# Patient Record
Sex: Male | Born: 1980 | Race: White | Hispanic: No | Marital: Single | Smoking: Current some day smoker
Health system: Southern US, Community
[De-identification: ages and names within clinical notes are randomized; demographics above are authoritative.]

## PROBLEM LIST (undated history)

## (undated) DIAGNOSIS — R361 Hematospermia: Secondary | ICD-10-CM

## (undated) DIAGNOSIS — R310 Gross hematuria: Secondary | ICD-10-CM

---

## 2009-02-04 ENCOUNTER — Encounter: Admission: RE | Admit: 2009-02-04 | Discharge: 2009-02-04 | Payer: Self-pay | Admitting: Family Medicine

## 2014-05-26 ENCOUNTER — Other Ambulatory Visit: Payer: Self-pay | Admitting: Family Medicine

## 2014-05-26 DIAGNOSIS — R319 Hematuria, unspecified: Secondary | ICD-10-CM

## 2014-05-27 ENCOUNTER — Ambulatory Visit
Admission: RE | Admit: 2014-05-27 | Discharge: 2014-05-27 | Disposition: A | Discharge status: Home / Self Care | Payer: 59 | Source: Ambulatory Visit | Attending: Family Medicine | Admitting: Family Medicine

## 2014-05-27 DIAGNOSIS — R319 Hematuria, unspecified: Secondary | ICD-10-CM

## 2014-05-27 MED ORDER — IOHEXOL 300 MG/ML  SOLN
150.0000 mL | Freq: Once | INTRAMUSCULAR | Status: AC | PRN
  Administered 2014-05-27: 150 mL via INTRAVENOUS

## 2014-11-07 ENCOUNTER — Other Ambulatory Visit: Payer: Self-pay | Admitting: Urology

## 2014-11-07 ENCOUNTER — Encounter: Payer: Self-pay | Admitting: Registered Nurse

## 2014-11-16 ENCOUNTER — Encounter (HOSPITAL_BASED_OUTPATIENT_CLINIC_OR_DEPARTMENT_OTHER): Payer: Self-pay | Admitting: *Deleted

## 2014-11-23 ENCOUNTER — Ambulatory Visit (HOSPITAL_BASED_OUTPATIENT_CLINIC_OR_DEPARTMENT_OTHER): Admission: RE | Admit: 2014-11-23 | Payer: 59 | Source: Ambulatory Visit | Admitting: Urology

## 2014-11-23 ENCOUNTER — Encounter (HOSPITAL_BASED_OUTPATIENT_CLINIC_OR_DEPARTMENT_OTHER): Admission: RE | Payer: Self-pay | Source: Ambulatory Visit

## 2014-11-23 HISTORY — DX: Gross hematuria: R31.0

## 2014-11-23 HISTORY — DX: Hematospermia: R36.1

## 2014-11-23 SURGERY — CYSTOSCOPY WITH RETROGRADE URETHROGRAM
Anesthesia: General

## 2015-05-17 IMAGING — CT CT ABD-PEL WO/W CM
2 of 8 series · 15 of 46 positions shown, 19 images · IV contrast (WATER & [ID] OMNI 300)
Comparison: None.

CLINICAL DATA: Gross hematuria.

EXAM:
CT ABDOMEN AND PELVIS WITHOUT AND WITH CONTRAST
TECHNIQUE: Multidetector CT imaging of the abdomen and pelvis was performed
following the standard protocol before and following the bolus
administration of intravenous contrast.
CONTRAST:  150mL OMNIPAQUE IOHEXOL 300 MG/ML  SOLN

[Series 7: prone thin · axial · 0.72mm/px · z∈[-437,+29]mm · 12 of 541 slices shown, 16 images]
[im 50/541  soft-tissue]
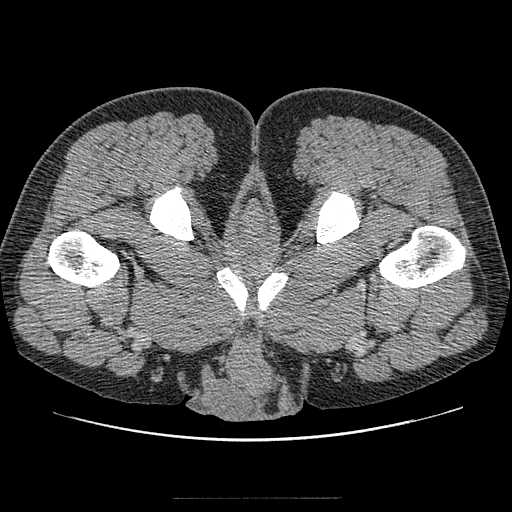
[im 50/541  bone]
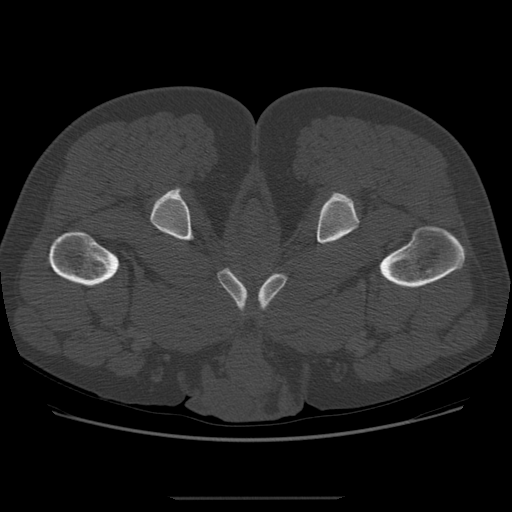
[im 99/541  soft-tissue]
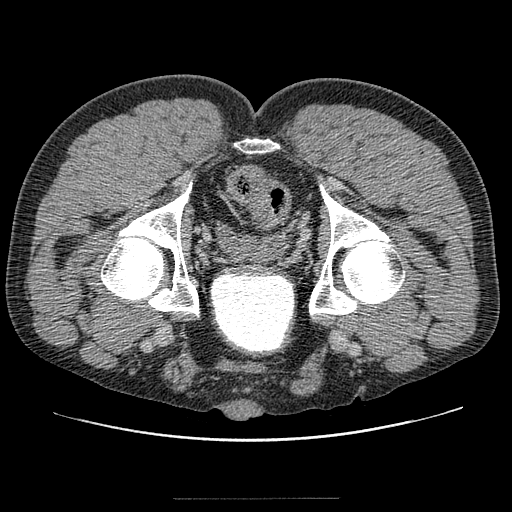
[im 148/541  soft-tissue]
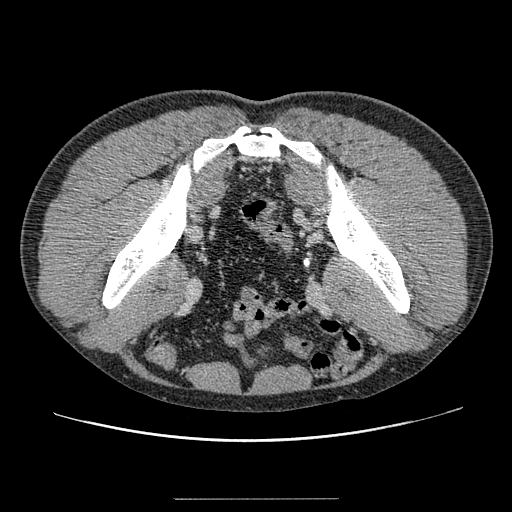
[im 197/541  soft-tissue]
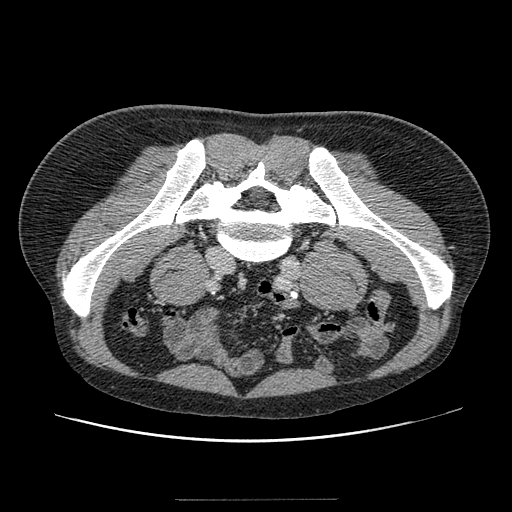
[im 246/541  soft-tissue]
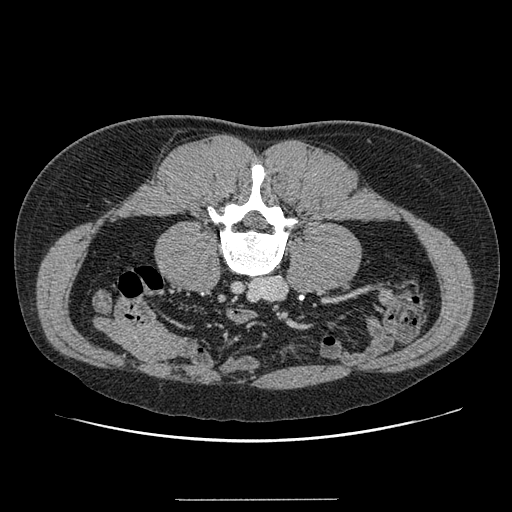
[im 295/541  soft-tissue]
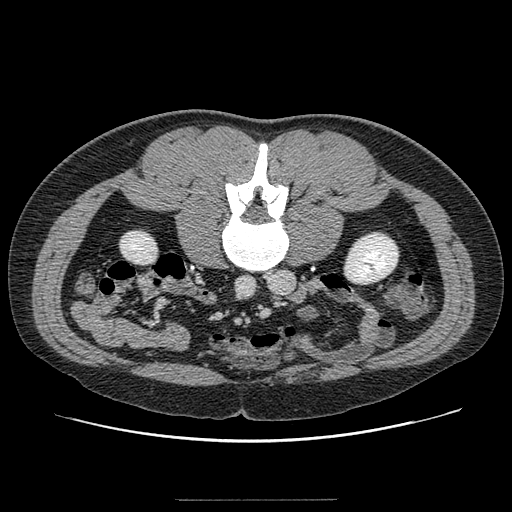
[im 344/541  soft-tissue]
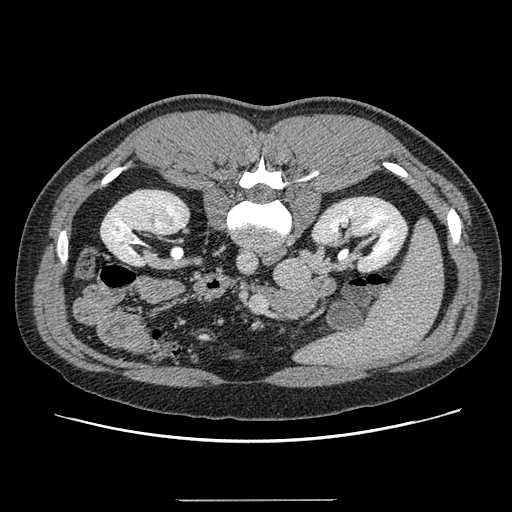
[im 393/541  soft-tissue]
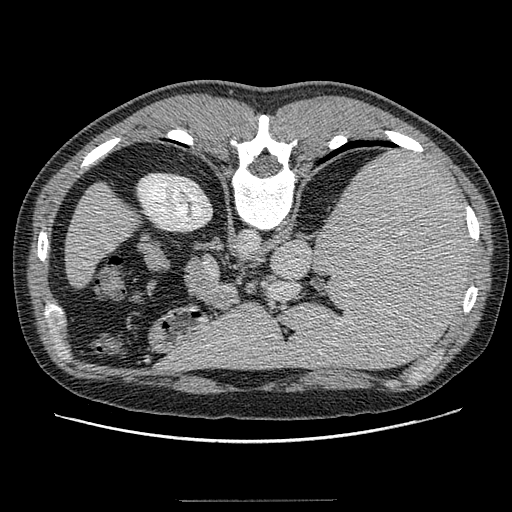
[im 442/541  soft-tissue]
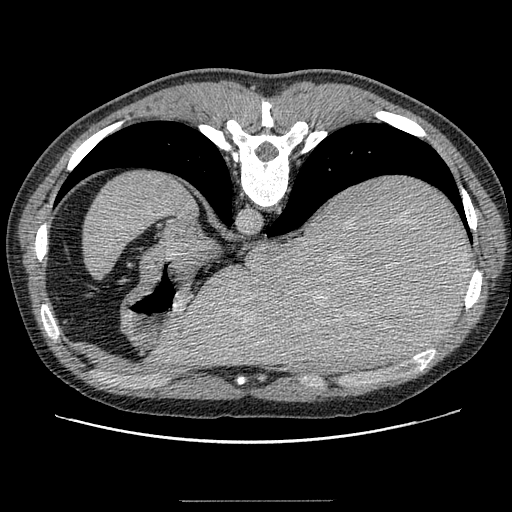
[im 442/541  lung]
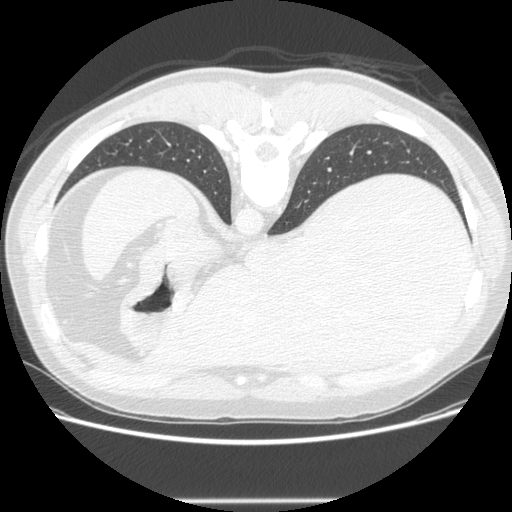
[im 442/541  bone]
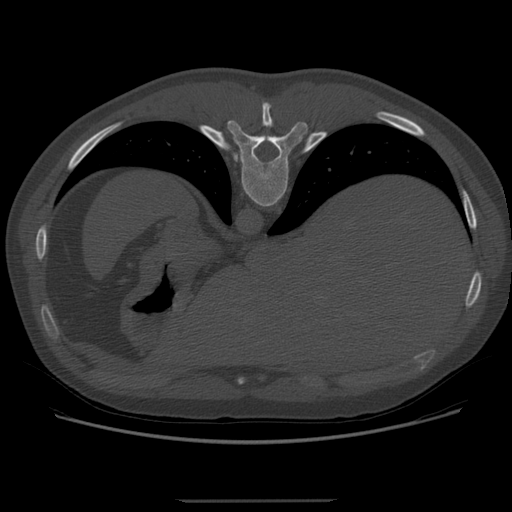
[im 467/541  lung]
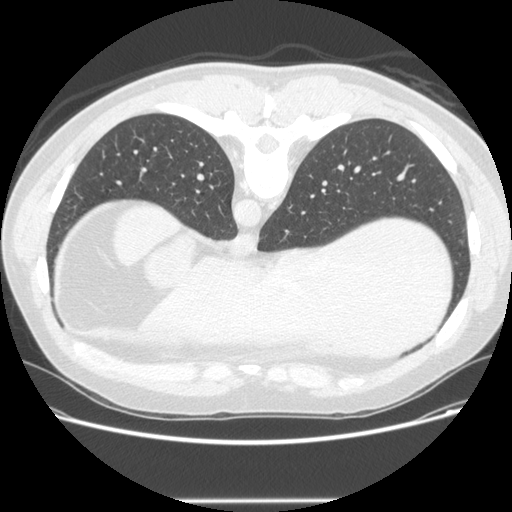
[im 491/541  soft-tissue]
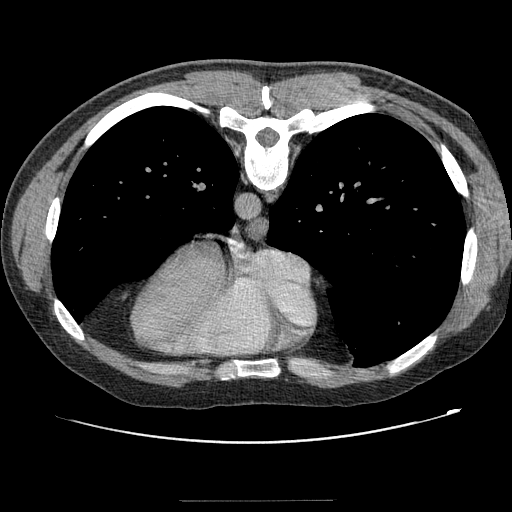
[im 491/541  lung]
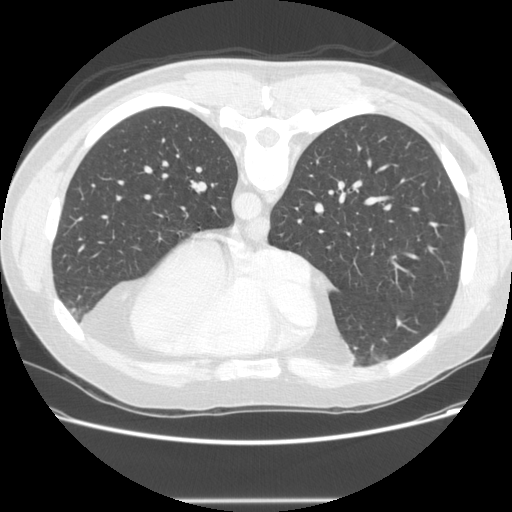
[im 516/541  lung]
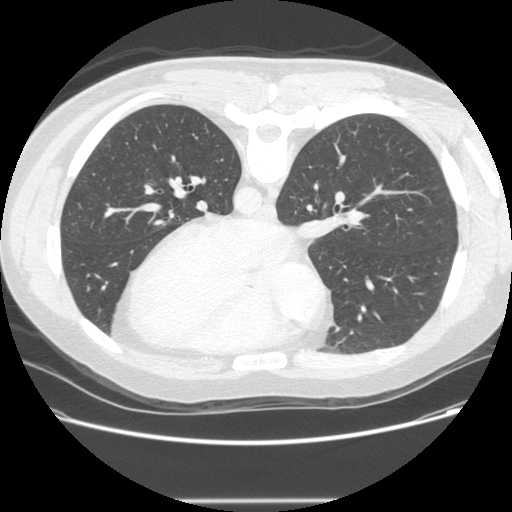

[Series 301: cor w/o · coronal · non-contrast · 1.00mm/px · 3 of 139 slices shown]
[im 35/139  soft-tissue]
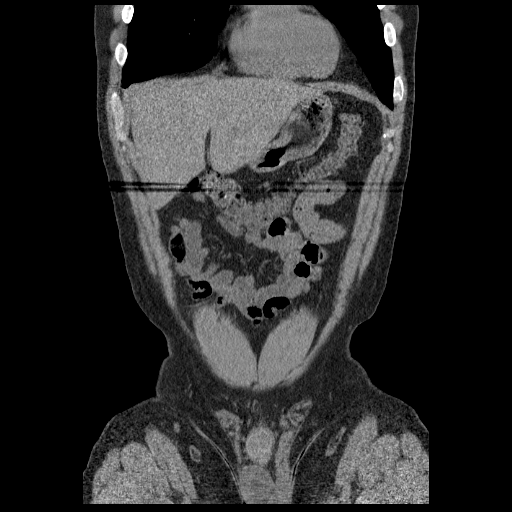
[im 70/139  soft-tissue]
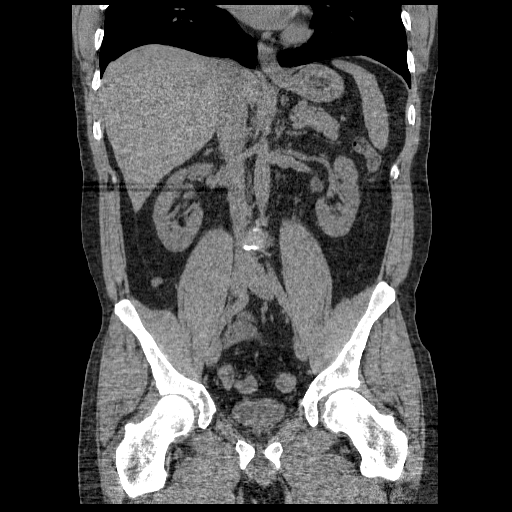
[im 104/139  soft-tissue]
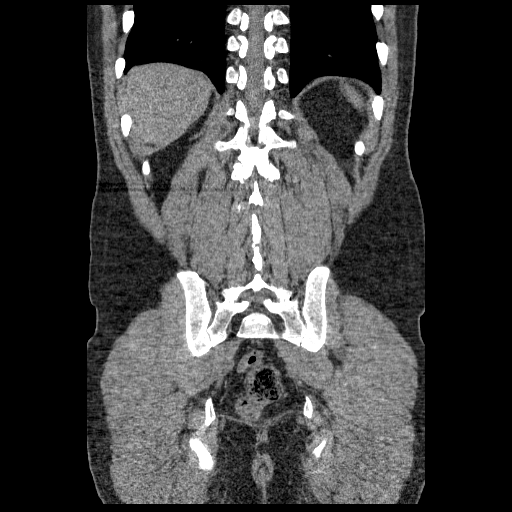

[15 of 46 positions shown; findings below may reference images not displayed]

FINDINGS: Lower chest:  Unremarkable.

Hepatobiliary: No masses or other significant abnormality
identified.

Pancreas: No cystic or solid masses identified. No peripancreatic
inflammatory changes or fluid collections.

Spleen:  Within normal limits in size and appearance.

Adrenal Glands:  No masses identified.

Kidneys/Urinary Tract: No evidence of urolithiasis or
hydronephrosis. No complex cystic or solid renal masses identified.
No masses seen involving lower urinary tract. Mild diffuse bladder
wall thickening is noted although the urinary bladder is
incompletely distended on this exam.

Stomach/Bowel/Peritoneum: No evidence of wall thickening, mass, or
obstruction.

Vascular/Lymphatic: No pathologically enlarged lymph nodes
identified. No other significant abnormality identified.

Reproductive:  No mass or other significant abnormality identified.

Other:  None.

Musculoskeletal:  No suspicious bone lesions identified.
IMPRESSION: No radiographic evidence of urinary tract neoplasm, urolithiasis, or
hydronephrosis.

Mild diffuse bladder wall thickening although bladder is
incompletely distended. Cystitis cannot be excluded. Suggest
correlation with urinalysis, and consider cystoscopy for further
evaluation.

## 2015-07-07 ENCOUNTER — Emergency Department (HOSPITAL_COMMUNITY)
Admission: EM | Admit: 2015-07-07 | Discharge: 2015-07-08 | Disposition: A | Discharge status: Home / Self Care | Payer: Commercial Managed Care - HMO | Attending: Emergency Medicine | Admitting: Emergency Medicine

## 2015-07-07 ENCOUNTER — Emergency Department (HOSPITAL_COMMUNITY): Payer: Commercial Managed Care - HMO

## 2015-07-07 ENCOUNTER — Encounter (HOSPITAL_COMMUNITY): Payer: Self-pay | Admitting: Emergency Medicine

## 2015-07-07 DIAGNOSIS — Y9389 Activity, other specified: Secondary | ICD-10-CM | POA: Insufficient documentation

## 2015-07-07 DIAGNOSIS — Y998 Other external cause status: Secondary | ICD-10-CM | POA: Diagnosis not present

## 2015-07-07 DIAGNOSIS — S6992XA Unspecified injury of left wrist, hand and finger(s), initial encounter: Secondary | ICD-10-CM | POA: Diagnosis present

## 2015-07-07 DIAGNOSIS — W270XXA Contact with workbench tool, initial encounter: Secondary | ICD-10-CM | POA: Diagnosis not present

## 2015-07-07 DIAGNOSIS — Y9289 Other specified places as the place of occurrence of the external cause: Secondary | ICD-10-CM | POA: Diagnosis not present

## 2015-07-07 DIAGNOSIS — S61012A Laceration without foreign body of left thumb without damage to nail, initial encounter: Secondary | ICD-10-CM | POA: Insufficient documentation

## 2015-07-07 DIAGNOSIS — Z79899 Other long term (current) drug therapy: Secondary | ICD-10-CM | POA: Insufficient documentation

## 2015-07-07 NOTE — ED Notes (Signed)
Patient was cutting wood with an axe. While holding the wood patient cut his thumb with the axe. Patient is bleeding. Gauze applied to laceration

## 2015-07-08 NOTE — Discharge Instructions (Signed)

## 2015-07-08 NOTE — ED Provider Notes (Signed)
CSN: [id number]     Arrival date & time 07/07/15  2329 History   First MD Initiated Contact with Patient 07/08/15 0011     Chief Complaint  Patient presents with  . Laceration    left thumb     (Consider location/radiation/quality/duration/timing/severity/associated sxs/prior Treatment) Patient is a 35 y.o. male presenting with skin laceration. The history is provided by the patient. No language interpreter was used.  Laceration Location:  Hand Hand laceration location:  L finger Depth:  Through dermis Injury mechanism: Patient was chopping wood with an ax and his his left thumb causing laceration. Foreign body present:  No foreign bodies Relieved by:  Nothing Tetanus status:  Up to date   Past Medical History  Diagnosis Date  . Gross hematuria   . Hematospermia    History reviewed. No pertinent past surgical history. History reviewed. No pertinent family history. Social History  Substance Use Topics  . Smoking status: None  . Smokeless tobacco: None  . Alcohol Use: None    Review of Systems  Constitutional: Negative for fever.  Musculoskeletal:       See HPI.  Skin: Positive for wound.  Neurological: Negative for numbness.      Allergies  Review of patient's allergies indicates no known allergies.  Home Medications   Prior to Admission medications   Medication Sig Start Date End Date Taking? Authorizing Provider  Multiple Vitamin (MULTIVITAMIN WITH MINERALS) TABS tablet Take 1 tablet by mouth daily.   Yes Historical Provider, MD  omega-3 acid ethyl esters (LOVAZA) 1 g capsule Take 1 g by mouth 2 (two) times daily.  05/02/15  Yes Historical Provider, MD   BP 145/95 mmHg  Pulse 106  Temp(Src) 97.9 F (36.6 C) (Oral)  Resp 14  Ht  (1.778 m)  Wt 79.379 kg  BMI 25.11 kg/m2  SpO2 99% Physical Exam  Constitutional: He is oriented to person, place, and time. He appears well-developed and well-nourished. No distress.  Neck: Normal range of motion.   Pulmonary/Chest: Effort normal.  Musculoskeletal:  FROM PIP joint of left thumb.  Neurological: He is alert and oriented to person, place, and time.  Left thumb NVI.  Skin:  4 cm flap laceration left thumb from medial nail cuticle to central pad of thumb. Nail intact. No subungual hematoma.     ED Course  Procedures (including critical care time) Labs Review Labs Reviewed - No data to display  Imaging Review Dg Finger Thumb Left  07/08/2015  CLINICAL DATA:  35 year old male with trauma to the left palm. EXAM: LEFT THUMB 2+V COMPARISON:  None. FINDINGS: No acute fracture or dislocation. The soft tissue swelling. No radiopaque foreign object. IMPRESSION: No acute/ traumatic osseous pathology. Electronically Signed   By: Elgie Collard M.D.   On: 07/08/2015 00:35   I have personally reviewed and evaluated these images and lab results as part of my medical decision-making.   EKG Interpretation None     LACERATION REPAIR Performed by: Elpidio Anis A Authorized by: Elpidio Anis A Consent: Verbal consent obtained. Risks and benefits: risks, benefits and alternatives were discussed Consent given by: patient Patient identity confirmed: provided demographic data Prepped and Draped in normal sterile fashion Wound explored  Laceration Location: left thumb  Laceration Length: 4 cm  No Foreign Bodies seen or palpated  Anesthesia: local infiltration  Local anesthetic: lidocaine 1% w/o epinephrine  Anesthetic total: 2 ml  Irrigation method: syringe Amount of cleaning: standard  Skin closure: 4-0 prolene  Number  of sutures: 7  Technique: simple interrutped  Patient tolerance: Patient tolerated the procedure well with no immediate complications.  MDM   Final diagnoses:  Laceration of thumb, left, initial encounter    Uncomplicated flap laceration left thumb without tendon or nail injury.    Elpidio Anis, PA-C 07/08/15 1610  Dione Booze, MD 07/08/15 [phone]

## 2016-06-26 IMAGING — CR DG FINGER THUMB 2+V*L*
3 series · 3 of 3 positions shown · non-contrast
Comparison: None.

CLINICAL DATA: 34-year-old male with trauma to the left palm.

EXAM:
LEFT THUMB 2+V

[x finger pa left]
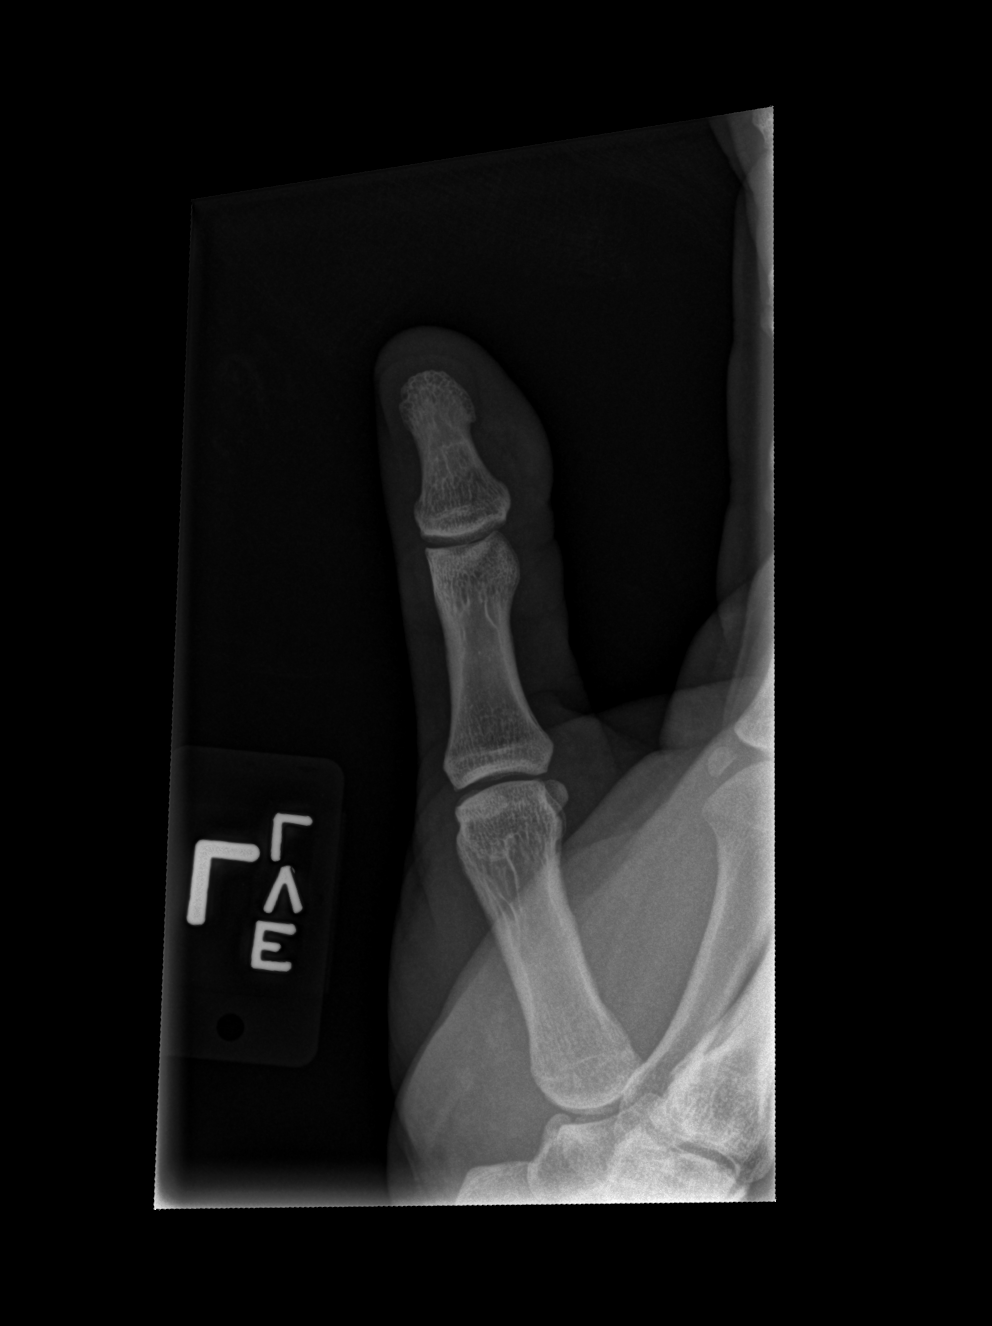

[x finger obl left]
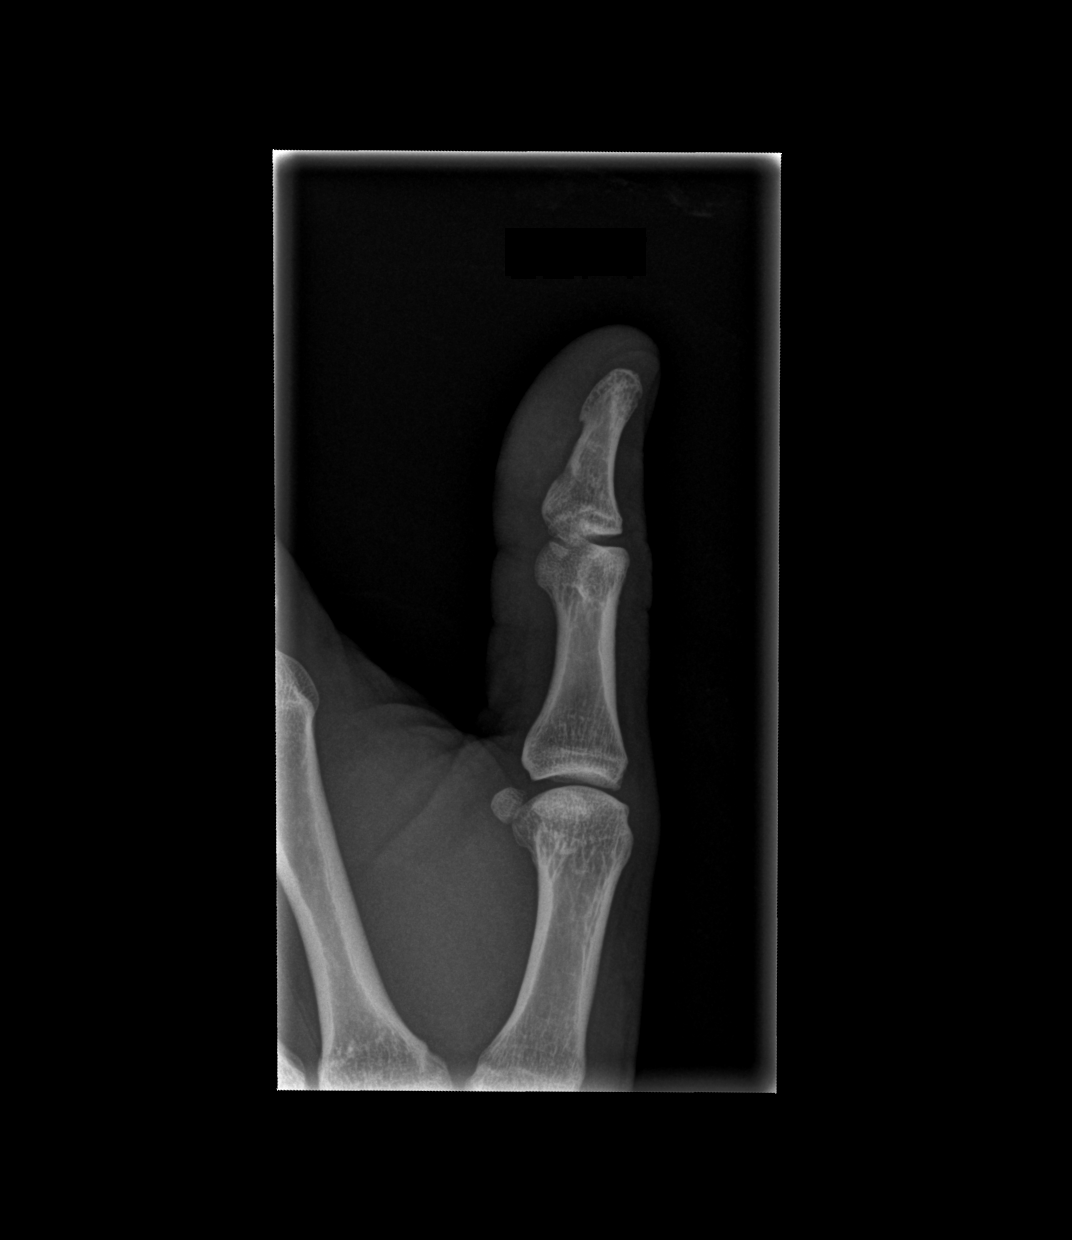

[x finger lat left]
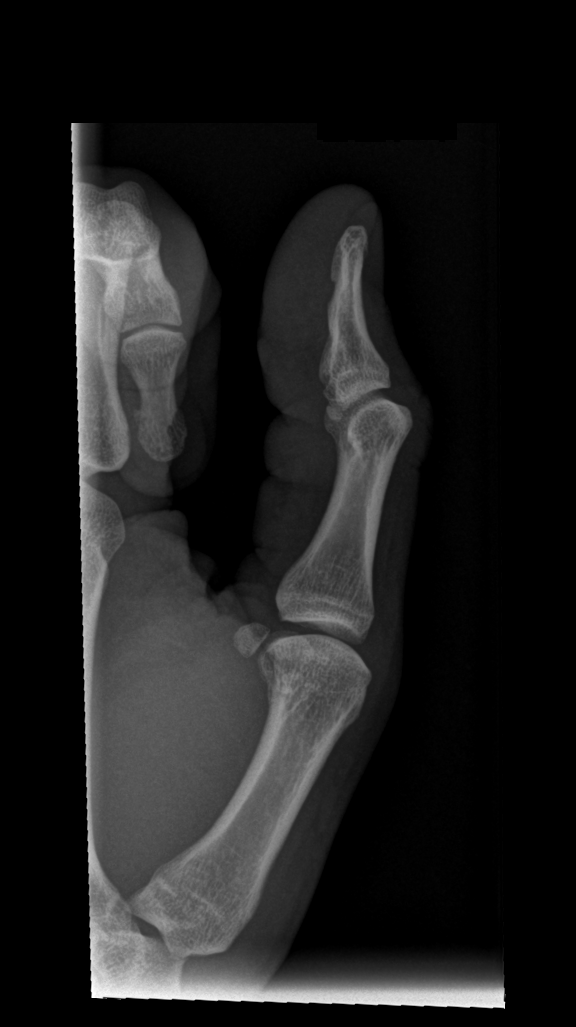

[3 of 3 positions shown; findings below may reference images not displayed]

FINDINGS: No acute fracture or dislocation. The soft tissue swelling. No
radiopaque foreign object.
IMPRESSION: No acute/ traumatic osseous pathology.

## 2017-01-21 DIAGNOSIS — E785 Hyperlipidemia, unspecified: Secondary | ICD-10-CM | POA: Diagnosis not present

## 2017-01-21 DIAGNOSIS — Z0000 Encounter for general adult medical examination without abnormal findings: Secondary | ICD-10-CM | POA: Diagnosis not present

## 2017-01-21 DIAGNOSIS — Z79899 Other long term (current) drug therapy: Secondary | ICD-10-CM | POA: Diagnosis not present

## 2017-12-02 DIAGNOSIS — M549 Dorsalgia, unspecified: Secondary | ICD-10-CM | POA: Diagnosis not present

## 2017-12-03 ENCOUNTER — Other Ambulatory Visit: Payer: Self-pay | Admitting: Family Medicine

## 2017-12-03 DIAGNOSIS — M5126 Other intervertebral disc displacement, lumbar region: Secondary | ICD-10-CM

## 2017-12-09 DIAGNOSIS — M4726 Other spondylosis with radiculopathy, lumbar region: Secondary | ICD-10-CM | POA: Diagnosis not present

## 2017-12-09 DIAGNOSIS — M5116 Intervertebral disc disorders with radiculopathy, lumbar region: Secondary | ICD-10-CM | POA: Diagnosis not present

## 2017-12-09 DIAGNOSIS — M545 Low back pain: Secondary | ICD-10-CM | POA: Diagnosis not present

## 2017-12-12 ENCOUNTER — Other Ambulatory Visit: Payer: Self-pay | Admitting: Orthopedic Surgery

## 2017-12-12 DIAGNOSIS — M5116 Intervertebral disc disorders with radiculopathy, lumbar region: Secondary | ICD-10-CM

## 2017-12-23 ENCOUNTER — Ambulatory Visit (INDEPENDENT_AMBULATORY_CARE_PROVIDER_SITE_OTHER): Payer: Self-pay | Admitting: Orthopaedic Surgery

## 2018-03-06 DIAGNOSIS — E785 Hyperlipidemia, unspecified: Secondary | ICD-10-CM | POA: Diagnosis not present

## 2018-03-06 DIAGNOSIS — Z0000 Encounter for general adult medical examination without abnormal findings: Secondary | ICD-10-CM | POA: Diagnosis not present

## 2018-03-25 DIAGNOSIS — D696 Thrombocytopenia, unspecified: Secondary | ICD-10-CM | POA: Diagnosis not present

## 2018-07-13 DIAGNOSIS — R310 Gross hematuria: Secondary | ICD-10-CM | POA: Diagnosis not present

## 2018-07-16 DIAGNOSIS — R310 Gross hematuria: Secondary | ICD-10-CM | POA: Diagnosis not present

## 2018-07-16 DIAGNOSIS — R361 Hematospermia: Secondary | ICD-10-CM | POA: Diagnosis not present

## 2018-07-30 DIAGNOSIS — R310 Gross hematuria: Secondary | ICD-10-CM | POA: Diagnosis not present

## 2018-08-09 ENCOUNTER — Other Ambulatory Visit: Payer: Self-pay

## 2018-08-09 ENCOUNTER — Emergency Department (HOSPITAL_COMMUNITY)
Admission: EM | Admit: 2018-08-09 | Discharge: 2018-08-09 | Disposition: A | Discharge status: Home / Self Care | Payer: 59 | Attending: Emergency Medicine | Admitting: Emergency Medicine

## 2018-08-09 ENCOUNTER — Emergency Department (HOSPITAL_COMMUNITY): Payer: 59

## 2018-08-09 ENCOUNTER — Encounter (HOSPITAL_COMMUNITY): Payer: Self-pay | Admitting: Emergency Medicine

## 2018-08-09 DIAGNOSIS — Y998 Other external cause status: Secondary | ICD-10-CM | POA: Diagnosis not present

## 2018-08-09 DIAGNOSIS — Y92838 Other recreation area as the place of occurrence of the external cause: Secondary | ICD-10-CM | POA: Insufficient documentation

## 2018-08-09 DIAGNOSIS — M25512 Pain in left shoulder: Secondary | ICD-10-CM | POA: Diagnosis not present

## 2018-08-09 DIAGNOSIS — S43102A Unspecified dislocation of left acromioclavicular joint, initial encounter: Secondary | ICD-10-CM | POA: Diagnosis not present

## 2018-08-09 DIAGNOSIS — Y9372 Activity, wrestling: Secondary | ICD-10-CM | POA: Insufficient documentation

## 2018-08-09 DIAGNOSIS — W01198A Fall on same level from slipping, tripping and stumbling with subsequent striking against other object, initial encounter: Secondary | ICD-10-CM | POA: Diagnosis not present

## 2018-08-09 DIAGNOSIS — F17210 Nicotine dependence, cigarettes, uncomplicated: Secondary | ICD-10-CM | POA: Diagnosis not present

## 2018-08-09 DIAGNOSIS — S4992XA Unspecified injury of left shoulder and upper arm, initial encounter: Secondary | ICD-10-CM | POA: Diagnosis not present

## 2018-08-09 NOTE — ED Notes (Signed)
Pt reports that he was wrestling around outside last night and landed on left shoulder. Patient adds that he and others Googled online how to put a shoulder back in place and attempted.

## 2018-08-09 NOTE — Discharge Instructions (Addendum)
As discussed, it is important that you keep your arm immobilized, use ibuprofen, Tylenol, and topical medication patches including the ingredients lidocaine and methyl salicylate for control of the pain.  Please schedule an appointment with our orthopedic colleagues, call tomorrow.  The x-ray interpretation of your shoulder injury is included below: EXAM: LEFT SHOULDER - 2+ VIEW  COMPARISON:  None.  FINDINGS: The distal clavicle is elevated relative to the acromion consistent with grade 3 AC joint separation. The humeral head is located. No fracture. Imaged left lung and ribs appear normal.  IMPRESSION: Grade 3 AC joint separation.   Electronically Signed   By: Drusilla Kanner M.D.   On: 08/09/2018 10:09

## 2018-08-09 NOTE — ED Triage Notes (Signed)
Pt with L shoulder injury last night, pt with L shoulder injury.

## 2018-08-09 NOTE — ED Provider Notes (Signed)
[facility]-EMERGENCY DEPT Provider Note   CSN: [id number] Arrival date & time: 08/09/18  1624    History   Chief Complaint Chief Complaint  Patient presents with  . Shoulder Injury  . Shoulder Pain    HPI Alexander Bentley is a 38 y.o. male.     HPI Patient presents with concern of left shoulder pain. Patient has history of hematuria for which he is working with urology, but has no other medical conditions. He denies changes in this, states that he has been generally well Until yesterday, the patient was asymptomatic. Yesterday, while wrestling, the patient fell onto the ground, striking his left shoulder. Since that time he has had severe pain in the left shoulder. With a companion he tried to reduce a perceived dislocation, without substantial change in his pain level. Pain is nonradiating, superior, worse with motion. No other injuries, including head trauma, no other complaints. Past Medical History:  Diagnosis Date  . Gross hematuria   . Hematospermia     There are no active problems to display for this patient.   History reviewed. No pertinent surgical history.      Home Medications    Prior to Admission medications   Medication Sig Start Date End Date Taking? Authorizing Provider  Multiple Vitamin (MULTIVITAMIN WITH MINERALS) TABS tablet Take 1 tablet by mouth daily.    [provider]  omega-3 acid ethyl esters (LOVAZA) 1 g capsule Take 1 g by mouth 2 (two) times daily.  05/02/15   [provider]    Family History No family history on file.  Social History Social History   Tobacco Use  . Smoking status: Current Some Day Smoker    Types: Cigarettes  . Smokeless tobacco: Never Used  Substance Use Topics  . Alcohol use: Not on file  . Drug use: Never     Allergies   Fibrates   Review of Systems Review of Systems  Constitutional:       Per HPI, otherwise negative  HENT:       Per HPI, otherwise  negative  Respiratory:       Per HPI, otherwise negative  Cardiovascular:       Per HPI, otherwise negative  Gastrointestinal: Negative for vomiting.  Endocrine:       Negative aside from HPI  Genitourinary:       Neg aside from HPI   Musculoskeletal:       Per HPI, otherwise negative  Skin: Negative.   Neurological: Negative for syncope.     Physical Exam Updated Vital Signs BP 132/87 (BP Location: Right Arm)   Pulse 99   Temp 98.6 F (37 C) (Oral)   Resp 16   Ht 5\' 10"  (1.778 m)   Wt 72.6 kg   SpO2 98%   BMI 22.96 kg/m   Physical Exam Vitals signs and nursing note reviewed.  Constitutional:      General: He is not in acute distress.    Appearance: He is well-developed.  HENT:     Head: Normocephalic and atraumatic.  Eyes:     Conjunctiva/sclera: Conjunctivae normal.  Cardiovascular:     Rate and Rhythm: Normal rate and regular rhythm.  Pulmonary:     Effort: Pulmonary effort is normal. No respiratory distress.     Breath sounds: No stridor.  Abdominal:     General: There is no distension.  Musculoskeletal:     Right shoulder: Normal.     Left elbow: Normal.  Left wrist: Normal.       Arms:  Skin:    General: Skin is warm and dry.  Neurological:     Mental Status: He is alert and oriented to person, place, and time.      ED Treatments / Results   Radiology Dg Shoulder Left  Result Date: 08/09/2018 CLINICAL DATA:  Patient suffered a left shoulder injury last night wrestling. Pain. Initial encounter. EXAM: LEFT SHOULDER - 2+ VIEW COMPARISON:  None. FINDINGS: The distal clavicle is elevated relative to the acromion consistent with grade 3 AC joint separation. The humeral head is located. No fracture. Imaged left lung and ribs appear normal. IMPRESSION: Grade 3 AC joint separation. Electronically Signed   By: Drusilla Kanner M.D.   On: 08/09/2018 10:09    Procedures Procedures (including critical care time)  Medications Ordered in  ED Medications - No data to display   Initial Impression / Assessment and Plan / ED Course  I have reviewed the triage vital signs and the nursing notes.  Pertinent labs & imaging results that were available during my care of the patient were reviewed by me and considered in my medical decision making (see chart for details).  Young male presents with shoulder injury, is found to have grade 3 AC joint separation. I demonstrated the x-ray myself to the patient. No evidence for other shoulder pathology, patient placed in a sling here, and after conversation on appropriate pain management, need for orthopedics follow-up, patient discharged in stable condition.  Final Clinical Impressions(s) / ED Diagnoses   Final diagnoses:  Separation of left acromioclavicular joint, initial encounter     Gerhard Munch, MD 08/09/18 1016

## 2018-08-11 DIAGNOSIS — M25511 Pain in right shoulder: Secondary | ICD-10-CM | POA: Diagnosis not present

## 2018-08-24 ENCOUNTER — Ambulatory Visit (INDEPENDENT_AMBULATORY_CARE_PROVIDER_SITE_OTHER): Payer: 59 | Admitting: Orthopaedic Surgery

## 2018-08-24 ENCOUNTER — Ambulatory Visit (INDEPENDENT_AMBULATORY_CARE_PROVIDER_SITE_OTHER): Payer: Self-pay

## 2018-08-24 DIAGNOSIS — S43102D Unspecified dislocation of left acromioclavicular joint, subsequent encounter: Secondary | ICD-10-CM

## 2018-08-24 NOTE — Progress Notes (Signed)
   Patient is a very pleasant 38 year old gentleman who is right at 2 weeks out from a hard mechanical fall injuring his left shoulder.  He sustained a type III AC joint separation.  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  He is very active and [place]  He did see another orthopedic surgeon in town who recommended surgery.  He wants a second opinion as it relates to that.  He is having pain mainly at night and with activities in his left shoulder.  He denies any neck issues.  He denies any numbness tingling down his hand.  He is otherwise a very healthy 38 year old with no active medical problems.  He has never had surgery before.  On exam we had a take his shirt off.  He has an obvious cosmetic deformity when you look at the shoulder on both sides.  Certainly the clavicle at the [facility] joint is higher.  It is mobile and I push down on it and causes pain but it can be reduced when you push very hard on it.  I went over x-rays with him in detail from 2 weeks ago and today and it does show a significant AC joint separation of the left shoulder with no evidence of fracture.  We had a long thorough discussion about surgical nonsurgical options.  He understands that I do not perform the surgery.  I would recommend at least if he stays with his group having my partner Dr. August Saucer see him for the surgery.  However Dr. August Saucer is out of town this week.  I did speak highly of the orthopedic surgeon that he saw for the first opinion.  That surgeon is fellowship trained in very accomplished and certainly would do a good job on his shoulder in terms of surgical correction of the malalignment.  We had a long and thorough discussion about this in detail.  All question concerns were answered and addressed.  Follow-up with me is as needed.  I am happy to get an appoint with Dr. August Saucer if needed.  Also would feel comfortable with him seeing his original orthopedic surgeon for that  surgery.

## 2018-08-26 DIAGNOSIS — M25512 Pain in left shoulder: Secondary | ICD-10-CM | POA: Diagnosis not present

## 2022-11-26 ENCOUNTER — Telehealth: Payer: Self-pay | Admitting: Genetic Counselor

## 2022-11-26 NOTE — Telephone Encounter (Signed)
Patient is aware of rescheduled appointment times/dates.

## 2022-11-28 ENCOUNTER — Encounter: Payer: 59 | Admitting: Genetic Counselor

## 2022-11-28 ENCOUNTER — Other Ambulatory Visit: Payer: 59

## 2023-01-09 ENCOUNTER — Encounter: Payer: Self-pay | Admitting: Genetic Counselor

## 2023-01-09 ENCOUNTER — Inpatient Hospital Stay: Payer: 59

## 2023-01-09 ENCOUNTER — Inpatient Hospital Stay: Payer: 59 | Attending: Internal Medicine | Admitting: Genetic Counselor

## 2023-01-09 ENCOUNTER — Other Ambulatory Visit: Payer: Self-pay | Admitting: Genetic Counselor

## 2023-01-09 DIAGNOSIS — Z8042 Family history of malignant neoplasm of prostate: Secondary | ICD-10-CM

## 2023-01-09 DIAGNOSIS — Z803 Family history of malignant neoplasm of breast: Secondary | ICD-10-CM

## 2023-01-09 LAB — GENETIC SCREENING ORDER

## 2023-01-09 NOTE — Progress Notes (Signed)
REFERRING PROVIDER: Mila Palmer, MD [address] [place],  [place] [address]  PRIMARY PROVIDER:  Mila Palmer, MD  PRIMARY REASON FOR VISIT:  Encounter Diagnoses  Name Primary?   Family history of prostate cancer Yes   Family history of breast cancer       HISTORY OF PRESENT ILLNESS:   Alexander Bentley, a 42 y.o. male, was seen for a [place] cancer genetics consultation at the request of Dr. Paulino Rily due to a family history of lymphoma and other cancers.  Alexander Bentley presents to clinic today to discuss the possibility of a hereditary predisposition to cancer, to discuss genetic testing, and to further clarify his future cancer risks, as well as potential cancer risks for family members.   Alexander Bentley is a 42 y.o. male with no personal history of cancer.   CANCER HISTORY:  Oncology History   No history exists.    RISK FACTORS:  Colonoscopy: no; not examined PSA screening: most recent April 2024 Dermatology: no  Past Medical History:  Diagnosis Date   Gross hematuria    Hematospermia      FAMILY HISTORY:  We obtained a detailed, 4-generation family history.  Significant diagnoses are listed below: Family History  Problem Relation Age of Onset   Lymphoma Father 70       stage IV   Breast cancer Maternal Aunt        dx <50   Prostate cancer Paternal Uncle 87       metastatic   Prostate cancer Paternal Grandfather        metastatic; d. 71s   Lymphoma Paternal Grandfather      Alexander Bentley is unaware of previous family history of genetic testing for hereditary cancer risks. Other relatives are unavailable for genetic testing at this time.  There is no reported Ashkenazi Jewish ancestry. There is no known consanguinity.  GENETIC COUNSELING ASSESSMENT: Alexander Bentley is a 42 y.o. male with a family history which is somewhat suggestive of a hereditary cancer syndrome and predisposition to cancer given the presence of metastatic prostate cancer in multiple  generations of his paternal family and the presence of premenopausal breast cancer in his maternal family. We, therefore, discussed and recommended the following at today's visit.   DISCUSSION: We discussed that 5 - 10% of cancer is hereditary, with most cases of hereditary prostate or breast cancer associated with mutations in BRCA1/2.  There are other genes that can be associated with hereditary prostate, breast, or other cancer syndromes.  We discussed that testing is beneficial for several reasons, including knowing about other cancer risks, identifying potential screening and risk-reduction options that may be appropriate, and to understanding if other family members could be at risk for cancer and allowing them to undergo genetic testing.  We reviewed the characteristics, features and inheritance patterns of hereditary cancer syndromes. We also discussed genetic testing, including the appropriate family members to test, the process of testing, insurance coverage and turn-around-time for results. We discussed the implications of a negative, positive, and variant of uncertain significant result. We discussed that negative results would be uninformative given that Alexander Bentley does not have a personal history of cancer. We recommended Alexander Bentley pursue genetic testing for a panel that contains genes associated with breast, prostate, and other cancers.  Alexander Bentley was offered a common hereditary cancer panel (47 genes) and an expanded pan-cancer panel (71 genes). Alexander Bentley was informed of the benefits and limitations of each panel, including that  expanded pan-cancer panels contain several genes that do not have clear management guidelines at this point in time.  We also discussed that as the number of genes included on a panel increases, the chances of variants of uncertain significance increases.  After considering the benefits and limitations of each gene panel, Alexander Bentley elected to have an expanded pan-cancer panel  through [employer]  The CancerNext-Expanded gene panel offered by [facility] and includes sequencing, rearrangement, and RNA analysis for the following 71 genes:  AIP, ALK, APC, ATM, BAP1, BARD1, BMPR1A, BRCA1, BRCA2, BRIP1, CDC73, CDH1, CDK4, CDKN1B, CDKN2A, CHEK2, DICER1, FH, FLCN, KIF1B, LZTR1,MAX, MEN1, MET, MLH1, MSH2, MSH6, MUTYH, NF1, NF2, NTHL1, PALB2, PHOX2B, PMS2, POT1, PRKAR1A, PTCH1, PTEN, RAD51C,RAD51D, RB1, RET, SDHA, SDHAF2, SDHB, SDHC, SDHD, SMAD4, SMARCA4, SMARCB1, SMARCE1, STK11, SUFU, TMEM127, TP53,TSC1, TSC2 and VHL (sequencing and deletion/duplication); AXIN2, CTNNA1, EGFR, EGLN1, HOXB13, KIT, MITF, MSH3, PDGFRA, POLD1 and POLE (sequencing only); EPCAM and GREM1 (deletion/duplication only).    Based on Alexander Bentley personal history of cancer (metastatic prostate cancer in paternal family; breast cancer before age 41 in maternal family), he meets medical criteria for genetic testing. Despite that he meets criteria, he may still have an out of pocket cost. We discussed that if his out of pocket cost for testing is over $100, the laboratory should contact them to discuss self-pay options and/or patient pay assistance programs.   We discussed the Genetic Information Non-Discrimination Act (GINA) of 2008, which helps protect individuals against genetic discrimination based on their genetic test results.  It impacts both health insurance and employment.  With health insurance, it protects against genetic test results being used for increased premiums or policy termination. For employment, it protects against hiring, firing and promoting decisions based on genetic test results.  GINA does not apply to those in the [employer], those who work for companies with less than 15 employees, and new life insurance or long-term disability insurance policies.  Health status due to a cancer diagnosis is not protected under GINA.  PLAN: After considering the risks, benefits, and limitations, Alexander Bentley  provided informed consent to pursue genetic testing and the blood sample was sent to [facility] for analysis of the CancerNext-Expanded +RNAinsight Panel. Results should be available within approximately 3 weeks' time, at which point they will be disclosed by telephone to Alexander Bentley, as will any additional recommendations warranted by these results. Alexander Bentley will receive a summary of his genetic counseling visit and a copy of his results once available. This information will also be available in Epic.   Alexander Bentley questions were answered to his satisfaction today. Our contact information was provided should additional questions or concerns arise. Thank you for the referral and allowing [place] to share in the care of your patient.   Ry Moody M. Rennie Plowman, MS, [facility] Genetic Counselor [email or web address] (P) [phone]   The patient was seen for a total of 35 minutes in face-to-face genetic counseling.  The patient was seen alone.  Drs. Pamelia Hoit and/or Mosetta Putt were available to discuss this case as needed.  _______________________________________________________________________ For Office Staff:  Number of people involved in session: 1 Was an Intern/ student involved with case: no

## 2023-01-21 ENCOUNTER — Encounter: Payer: Self-pay | Admitting: Genetic Counselor

## 2023-01-21 ENCOUNTER — Ambulatory Visit: Payer: Self-pay | Admitting: Genetic Counselor

## 2023-01-21 ENCOUNTER — Telehealth: Payer: Self-pay | Admitting: Genetic Counselor

## 2023-01-21 DIAGNOSIS — Z8042 Family history of malignant neoplasm of prostate: Secondary | ICD-10-CM

## 2023-01-21 DIAGNOSIS — Z1379 Encounter for other screening for genetic and chromosomal anomalies: Secondary | ICD-10-CM

## 2023-01-21 DIAGNOSIS — Z803 Family history of malignant neoplasm of breast: Secondary | ICD-10-CM

## 2023-01-21 NOTE — Telephone Encounter (Signed)
Disclosed negative genetics.    

## 2023-01-21 NOTE — Progress Notes (Signed)
HPI:   Alexander Bentley was previously seen in the [facility] due to a family history of prostate and other cancers and concerns regarding a hereditary predisposition to cancer.    Alexander Bentley recent genetic test results were disclosed to him by telephone. These results and recommendations are discussed in more detail below.  CANCER HISTORY:  Oncology History   No history exists.      FAMILY HISTORY:  We obtained a detailed, 4-generation family history.  Significant diagnoses are listed below:      Family History  Problem Relation Age of Onset   Lymphoma Father 19        stage IV   Breast cancer Maternal Aunt          dx <50   Prostate cancer Paternal Uncle 35        metastatic   Prostate cancer Paternal Grandfather          metastatic; d. 5s   Lymphoma Paternal Grandfather         Alexander Bentley is unaware of previous family history of genetic testing for hereditary cancer risks. Other relatives are unavailable for genetic testing at this time.   There is no reported Ashkenazi Jewish ancestry. There is no known consanguinity.    GENETIC TEST RESULTS:  The [facility] CancerNext-Expanded +RNAinsight Panel found no pathogenic mutations. The CancerNext-Expanded gene panel offered by [facility] and includes sequencing, rearrangement, and RNA analysis for the following 71 genes:  AIP, ALK, APC, ATM, BAP1, BARD1, BMPR1A, BRCA1, BRCA2, BRIP1, CDC73, CDH1, CDK4, CDKN1B, CDKN2A, CHEK2, DICER1, FH, FLCN, KIF1B, LZTR1,MAX, MEN1, MET, MLH1, MSH2, MSH6, MUTYH, NF1, NF2, NTHL1, PALB2, PHOX2B, PMS2, POT1, PRKAR1A, PTCH1, PTEN, RAD51C,RAD51D, RB1, RET, SDHA, SDHAF2, SDHB, SDHC, SDHD, SMAD4, SMARCA4, SMARCB1, SMARCE1, STK11, SUFU, TMEM127, TP53,TSC1, TSC2 and VHL (sequencing and deletion/duplication); AXIN2, CTNNA1, EGFR, EGLN1, HOXB13, KIT, MITF, MSH3, PDGFRA, POLD1 and POLE (sequencing only); EPCAM and GREM1 (deletion/duplication only).   The test report has been scanned into EPIC and  is located under the Molecular Pathology section of the Results Review tab.  A portion of the result report is included below for reference. Genetic testing reported out on January 20, 2023.       Even though a pathogenic variant was not identified, possible explanations for the cancer in the family may include: There may be no hereditary risk for cancer in the family. The cancers in Alexander Bentley family may be sporadic/familial or due to other genetic and environmental factors. There may be a gene mutation in one of these genes that current testing methods cannot detect but that chance is small. There could be another gene that has not yet been discovered, or that we have not yet tested, that is responsible for the cancer diagnoses in the family.  It is also possible there is a hereditary cause for the cancer in the family that Alexander Bentley did not inherit.   Therefore, it is important to remain in touch with cancer genetics in the future so that we can continue to offer Alexander Bentley the most up to date genetic testing.    ADDITIONAL GENETIC TESTING:   Alexander Bentley genetic testing was fairly extensive.  If there are additional relevant genes identified to increase cancer risk that can be analyzed in the future, we would be happy to discuss and coordinate this testing at that time.     CANCER SCREENING RECOMMENDATIONS:  Alexander Bentley test result is considered negative (normal).  This means  that we have not identified a hereditary cause for his family history of cancer at this time.   An individual's cancer risk and medical management are not determined by genetic test results alone. Overall cancer risk assessment incorporates additional factors, including personal medical history, family history, and any available genetic information that may result in a personalized plan for cancer prevention and surveillance. Therefore, it is recommended he continue to follow the cancer management and screening guidelines  provided by his oncology and primary healthcare provider.  RECOMMENDATIONS FOR FAMILY MEMBERS:   Since he did not inherit a identifiable mutation in a cancer predisposition gene included on this panel, his children could not have inherited a known mutation from him in one of these genes. Individuals in this family might be at some increased risk of developing cancer, over the general population risk, due to the family history of cancer.  Individuals in the family should notify their providers of the family history of cancer. We recommend women in this family have a yearly mammogram beginning at age 69, or 45 years younger than the earliest onset of cancer, an annual clinical breast exam, and perform monthly breast self-exams.  Risk models that take into account family history and hormonal history may be helpful in determining appropriate breast cancer screening options for family members. Male relatives should speak with their providers about prostate cancer screening.  Other members of the family may still carry a pathogenic variant in one of these genes that Alexander Bentley did not inherit. Based on the family history, we recommend relatives more closely related to his uncle with metastatic prostate cancer consider genetic counseling/testing.    FOLLOW-UP:  Cancer genetics is a rapidly advancing field and it is possible that new genetic tests will be appropriate for him and/or his family members in the future. We encourage Alexander Bentley to remain in contact with cancer genetics, so we can update his personal and family histories and let him know of advances in cancer genetics that may benefit this family.   Our contact number was provided.  He is welcome to call us at anytime with additional questions or concerns.   Crystol Walpole M. Rennie Plowman, MS, [facility] Genetic Counselor [email or web address] (P) [phone]

## 2023-02-19 ENCOUNTER — Other Ambulatory Visit: Payer: Self-pay | Admitting: Medical Genetics

## 2023-02-19 DIAGNOSIS — Z006 Encounter for examination for normal comparison and control in clinical research program: Secondary | ICD-10-CM

## 2024-04-06 ENCOUNTER — Other Ambulatory Visit: Payer: Self-pay | Admitting: Medical Genetics

## 2024-04-06 DIAGNOSIS — Z006 Encounter for examination for normal comparison and control in clinical research program: Secondary | ICD-10-CM
